# Patient Record
Sex: Female | Born: 1980 | Marital: Single | Smoking: Never smoker
Health system: Southern US, Community
[De-identification: ages and names within clinical notes are randomized; demographics above are authoritative.]

## PROBLEM LIST (undated history)

## (undated) DIAGNOSIS — J45909 Unspecified asthma, uncomplicated: Secondary | ICD-10-CM

## (undated) DIAGNOSIS — T7840XA Allergy, unspecified, initial encounter: Secondary | ICD-10-CM

## (undated) HISTORY — DX: Unspecified asthma, uncomplicated: J45.909

## (undated) HISTORY — DX: Allergy, unspecified, initial encounter: T78.40XA

---

## 2017-08-30 ENCOUNTER — Encounter: Payer: Self-pay | Admitting: Physician Assistant

## 2017-08-30 ENCOUNTER — Ambulatory Visit: Payer: No Typology Code available for payment source | Admitting: Physician Assistant

## 2017-08-30 ENCOUNTER — Other Ambulatory Visit: Payer: Self-pay

## 2017-08-30 VITALS — BP 122/78 | HR 91 | Temp 98.4°F | Resp 18 | Ht 65.2 in | Wt 227.6 lb

## 2017-08-30 DIAGNOSIS — Z7689 Persons encountering health services in other specified circumstances: Secondary | ICD-10-CM | POA: Diagnosis not present

## 2017-08-30 DIAGNOSIS — N926 Irregular menstruation, unspecified: Secondary | ICD-10-CM | POA: Diagnosis not present

## 2017-08-30 DIAGNOSIS — Z789 Other specified health status: Secondary | ICD-10-CM

## 2017-08-30 NOTE — Patient Instructions (Addendum)
I recommend you continue working out and eating a healthy diet.  For couples pursuing pregnancy, the highest probability of conception appears to be with intercourse one to two days prior to ovulation. Therefore, attempting to identify the fertile period and timing intercourse during this interval maximizes the probability of conception.   How To Track Ovulation: A woman's monthly cycle is measured from the first day of her menstrual period until the first day of her next period. On average, a woman's cycle normally is between 28-32 days, but some women may have much shorter or much longer cycles. Ovulation can be calculated by starting with the first day of the last menstrual period (LMP) or by calculating 12-16 days from the next expected period. Most women ovulate anywhere between Day 11 - Day 21 of their cycle, counting from the first day of the LMP. This is what many refer to as the "fertile time" of a woman's cycle because sexual intercourse during this time increases the chance of pregnancy. Ovulation can occur at various times during a cycle and may occur on a different day each month. It is important to track your cycle and fortunately, there are a number of free fertility charting tools available to help women identify their peak fertile days.   Please follow up in August for complete physical exam. Return sooner if you have any acute issues.  I would also like you to have your medical records sent over so I can go over them.  It was a pleasure meeting you today.    IF you received an x-ray today, you will receive an invoice from [facility]. Please contact [facility] at [phone] with questions or concerns regarding your invoice.   IF you received labwork today, you will receive an invoice from [facility]. Please contact [facility] at [phone] with questions or concerns regarding your invoice.   Our billing staff will not be able to assist you with questions  regarding bills from these companies.  You will be contacted with the lab results as soon as they are available. The fastest way to get your results is to activate your My Chart account. Instructions are located on the last page of this paperwork. If you have not heard from us regarding the results in 2 weeks, please contact this office.

## 2017-08-30 NOTE — Progress Notes (Addendum)
Kristine Vargas  MRN: [id number] DOB: 01/04/1981  Subjective:  Kristine Vargas is a 37 y.o. G1P1 female seen in office today for a chief complaint of menstrual problem and establish care.   She is from [place]. Just moved here in 04/2017 for a new job. Looking for new PCP.  Has PMH of seasonal allergies and intermittent asthma. She is taking allegra for allergies. Uses proair prn for intermittent asthma.   Irregular cycles: They occur once-twice per month for the past year.  Typically lasts 7 days. Denies dysmenorrhea and menorrhagia. Has been evaluated by gyn for this and had lab work/US, all of which was normal. Was determined to be from stress.She has been working on her diet. Decreased fast food consumption. Has been doing water aerobics 4-5 x a week.  For the past 3 months, cycles have been regular, occurring once per month.  She is trying to have another child and is just concerned. Has been trying for at least 6 months. She is not paying attention to when she is ovulating, she is just having unprotected sexual intercourse sporadically throughout the month (~4 x per month). Used to get depo injections, stopped about 5 years ago. She has read lots of stuff on the Internet that having had depo injections in the past could prevent her from having kids in the future Current child is 13 years. She is sexually active with monogamous partner. LMP 08/23/17. No hx of uterine abnormalities, thyroid disorder, or PCOS.    Review of Systems  Constitutional: Negative for activity change, appetite change, fatigue, fever and unexpected weight change.  Eyes: Negative for visual disturbance.  Gastrointestinal: Negative for abdominal pain, nausea and vomiting.  Endocrine: Negative for cold intolerance and heat intolerance.  Genitourinary: Negative for pelvic pain and vaginal pain.  Neurological: Negative for dizziness, light-headedness and headaches.    There are no active problems to display for this  patient.   No current outpatient medications on file prior to visit.   No current facility-administered medications on file prior to visit.     Allergies  Allergen Reactions  . Bactrim [Sulfamethoxazole-Trimethoprim]   . Ciprofloxacin   . Penicillins    Social History   Socioeconomic History  . Marital status: Single    Spouse name: Not on file  . Number of children: 1  . Years of education: Not on file  . Highest education level: Not on file  Occupational History  . Not on file  Social Needs  . Financial resource strain: Not on file  . Food insecurity:    Worry: Not on file    Inability: Not on file  . Transportation needs:    Medical: Not on file    Non-medical: Not on file  Tobacco Use  . Smoking status: Never Smoker  . Smokeless tobacco: Never Used  Substance and Sexual Activity  . Alcohol use: Yes    Comment: occ  . Drug use: Never  . Sexual activity: Yes  Lifestyle  . Physical activity:    Days per week: Not on file    Minutes per session: Not on file  . Stress: Not on file  Relationships  . Social connections:    Talks on phone: Not on file    Gets together: Not on file    Attends religious service: Not on file    Active member of club or organization: Not on file    Attends meetings of clubs or organizations: Not on file    Relationship  status: Not on file  . Intimate partner violence:    Fear of current or ex partner: Not on file    Emotionally abused: Not on file    Physically abused: Not on file    Forced sexual activity: Not on file  Other Topics Concern  . Not on file  Social History Narrative  . Not on file      Objective:  BP 122/78 (BP Location: Right Arm, Patient Position: Sitting, Cuff Size: Normal)   Pulse 91   Temp 98.4 F (36.9 C) (Oral)   Resp 18   Ht 5' 5.2" (1.656 m)   Wt 227 lb 9.6 oz (103.2 kg)   LMP 08/23/2017 (Approximate)   SpO2 98%   BMI 37.65 kg/m   Physical Exam  Constitutional: She is oriented to person,  place, and time. She appears well-developed and well-nourished. No distress.  HENT:  Head: Normocephalic and atraumatic.  Eyes: Conjunctivae and EOM are normal.  Neck: Normal range of motion.  Pulmonary/Chest: Effort normal.  Neurological: She is alert and oriented to person, place, and time.  Skin: Skin is warm and dry.  Psychiatric: She has a normal mood and affect. Her speech is normal and behavior is normal. Thought content normal.  Vitals reviewed.  Assessment and Plan :  1. Irregular menstrual cycle Cycles are currently regular with changes in diet and increasing physical activity.  Recommended she continue with these healthy lifestyle modifications.   2. Attempting to conceive Reassured patient that using depo injections 5 years ago is likely not contributing to her inability to conceive at this time.  Educated patient that it is likely due to to her having infrequent sexual intercourse and could be missing her timeframe of ovulation.  Educated patient on timing of ovulation.  Patient recently downloaded fertility and menstrual cycle app on phone but has not logged anything on it. Together, in office, we opened the app on her phone and logged her most recent menstrual cycle.  After this was logged, it gave a timeframe of when her ovulation is likely to occur.  Educated patient that the highest probability of conception appears to be with intercourse one to two days prior to ovulation. Therefore, engaging in sexual intercourse during that time frame interval maximizes the probability of conception. She voices her understanding.  She plans to follow-up in 4 months for CPE.  If at that time, she is still having difficulty conceiving despite increasing bouts of sexual intercourse and focusing on engaging in sexual intercourse during most fertile period, will refer to gyn for further evaluation.  3. Encounter to establish care I am happy to take over patient's care.  Recommended she have her  medical records from her prior PCP and gynecology office sent to our office so I can review labs and images that were ordered pertaining to her irregular menstrual cycles.  She is due for CPE in August.  Recommend she follow-up at that time for CPE.  A total of 30 minutes was spent in the room with the patient, greater than 50% of which was in counseling/coordination of care regarding irregular menstrual cycles and optimizing changes of conceiving.  Benjiman Core PA-C  Primary Care at [facility] 08/30/2017 4:33 PM

## 2018-04-21 ENCOUNTER — Encounter (HOSPITAL_COMMUNITY): Payer: Self-pay

## 2018-04-21 ENCOUNTER — Other Ambulatory Visit: Payer: Self-pay

## 2018-04-21 ENCOUNTER — Emergency Department (HOSPITAL_COMMUNITY)
Admission: EM | Admit: 2018-04-21 | Discharge: 2018-04-21 | Disposition: A | Discharge status: Home / Self Care | Payer: Self-pay | Attending: Emergency Medicine | Admitting: Emergency Medicine

## 2018-04-21 DIAGNOSIS — N3000 Acute cystitis without hematuria: Secondary | ICD-10-CM

## 2018-04-21 DIAGNOSIS — N3090 Cystitis, unspecified without hematuria: Secondary | ICD-10-CM | POA: Insufficient documentation

## 2018-04-21 LAB — URINALYSIS, ROUTINE W REFLEX MICROSCOPIC
Bilirubin Urine: NEGATIVE
Glucose, UA: NEGATIVE mg/dL
Ketones, ur: NEGATIVE mg/dL
NITRITE: NEGATIVE
Protein, ur: NEGATIVE mg/dL
Specific Gravity, Urine: 1.024 (ref 1.005–1.030)
pH: 6 (ref 5.0–8.0)

## 2018-04-21 LAB — POC URINE PREG, ED: Preg Test, Ur: NEGATIVE

## 2018-04-21 MED ORDER — NITROFURANTOIN MONOHYD MACRO 100 MG PO CAPS: 100.0000 mg | ORAL_CAPSULE | Freq: Two times a day (BID) | ORAL | 0 refills | Status: DC

## 2018-04-21 NOTE — ED Triage Notes (Addendum)
Pt states that she has had urinary frequency and cloudy urine. Pt states that her urine is malodorous as well. Pt denies back pain and states very slight pain with urination. Pt has had symptoms x 3 weeks.

## 2018-04-21 NOTE — ED Provider Notes (Signed)
[facility]-EMERGENCY DEPT Provider Note   CSN: [id number] Arrival date & time: 04/21/18  1731     History   Chief Complaint Chief Complaint  Patient presents with  . Urinary Frequency    HPI Kristine Vargas is a 37 y.o. female presenting for evaluation of urinary symptoms.  Patient states for the past 2 weeks she has had urinary frequency, and her urine has been dark and cloudy.  She states it has a different, more stronger urine smell.  She has mild suprapubic discomfort when urinating.  She denies fevers, chills, nausea, vomiting, or back pain.  She has been drinking water and cranberry juice without improvement of her symptoms.  Her last period was last week, it was regular for her.  She denies vaginal discharge or bleeding.  Patient states this feels similar to when she had urinary tract infections previously.  She is not sure which antibiotics worked for her previously.   HPI  Past Medical History:  Diagnosis Date  . Allergy   . Asthma     There are no active problems to display for this patient.   History reviewed. No pertinent surgical history.   OB History   None      Home Medications    Prior to Admission medications   Medication Sig Start Date End Date Taking? Authorizing Provider  nitrofurantoin, macrocrystal-monohydrate, (MACROBID) 100 MG capsule Take 1 capsule (100 mg total) by mouth 2 (two) times daily. 04/21/18   Yoselin Amerman, PA-C    Family History Family History  Problem Relation Age of Onset  . Arthritis Mother   . Hypertension Mother   . Hypertension Father   . Diabetes Father   . Hypertension Maternal Grandmother   . Hypertension Maternal Grandfather   . Hypertension Paternal Grandmother   . Diabetes Paternal Grandmother   . Diabetes Paternal Grandfather     Social History Social History   Tobacco Use  . Smoking status: Never Smoker  . Smokeless tobacco: Never Used  Substance Use Topics  . Alcohol use:  Yes    Comment: occ  . Drug use: Never     Allergies   Bactrim [sulfamethoxazole-trimethoprim]; Ciprofloxacin; and Penicillins   Review of Systems Review of Systems  Constitutional: Negative for fever.  Gastrointestinal: Negative for nausea and vomiting.  Genitourinary: Positive for frequency. Negative for dysuria.       Dark, cloudy, and more pungent urine     Physical Exam Updated Vital Signs BP 133/83 (BP Location: Right Arm)   Pulse 67   Temp 98.9 F (37.2 C) (Oral)   Resp 16   Ht 5\' 4"  (1.626 m)   Wt 104.3 kg   LMP 04/13/2018   SpO2 100%   BMI 39.48 kg/m   Physical Exam  Constitutional: She is oriented to person, place, and time. She appears well-developed and well-nourished. No distress.  Sitting comfortably in the bed in no acute distress  HENT:  Head: Normocephalic and atraumatic.  Eyes: EOM are normal.  Neck: Normal range of motion.  Cardiovascular: Normal rate, regular rhythm and intact distal pulses.  Pulmonary/Chest: Effort normal and breath sounds normal. No respiratory distress. She has no wheezes.  Abdominal: Soft. She exhibits no distension and no mass. There is no tenderness. There is no guarding.  No tenderness palpation of the abdomen.  Soft without rigidity, guarding, distention.  Negative rebound.  No CVA tenderness.  Musculoskeletal: Normal range of motion.  Neurological: She is alert and oriented to person, place,  and time.  Skin: Skin is warm. No rash noted.  Psychiatric: She has a normal mood and affect.  Nursing note and vitals reviewed.    ED Treatments / Results  Labs (all labs ordered are listed, but only abnormal results are displayed) Labs Reviewed  URINALYSIS, ROUTINE W REFLEX MICROSCOPIC - Abnormal; Notable for the following components:      Result Value   APPearance HAZY (*)    Hgb urine dipstick MODERATE (*)    Leukocytes, UA LARGE (*)    Bacteria, UA RARE (*)    All other components within normal limits  POC URINE  PREG, ED    EKG None  Radiology No results found.  Procedures Procedures (including critical care time)  Medications Ordered in ED Medications - No data to display   Initial Impression / Assessment and Plan / ED Course  I have reviewed the triage vital signs and the nursing notes.  Pertinent labs & imaging results that were available during my care of the patient were reviewed by me and considered in my medical decision making (see chart for details).     Patient presenting for evaluation of 2 weeks of urinary symptoms.  Physical exam reassuring, she is afebrile not tachycardic.  Appears nontoxic.  No abdominal tenderness or CVA tenderness.  Low suspicion for Pyelo or systemic infection.  Urine pending.  Urine positive for infection, shows large leuks, bacteria, and increased white cells.  Pregnancy negative.  Discussed findings with patient.  As patient is allergic to Bactrim, Cipro, and penicillins, will treat with Macrobid.  Patient encouraged to follow-up as needed if symptoms do not improve.  At this time, patient appears safe for discharge.  Return precautions given.  Patient states she understands and agrees to plan.   Final Clinical Impressions(s) / ED Diagnoses   Final diagnoses:  Acute cystitis without hematuria    ED Discharge Orders         Ordered    nitrofurantoin, macrocrystal-monohydrate, (MACROBID) 100 MG capsule  2 times daily     04/21/18 1904           Alveria Apley, PA-C 04/21/18 2243    Maia Plan, MD 04/22/18 [phone]

## 2018-04-21 NOTE — Discharge Instructions (Addendum)
Antibiotics as prescribed.  Take the entire course, even if your symptoms improve. Make sure you are staying well-hydrated with water. Use Tylenol and ibuprofen as needed for pain. Use heating pads to help with pain. Avoid things that increase inflammation including smoking and caffeine use. Return to the emergency room with any new, worsening, concerning symptoms

## 2018-05-27 ENCOUNTER — Emergency Department (HOSPITAL_COMMUNITY): Payer: Self-pay

## 2018-05-27 ENCOUNTER — Other Ambulatory Visit: Payer: Self-pay

## 2018-05-27 ENCOUNTER — Emergency Department (HOSPITAL_COMMUNITY)
Admission: EM | Admit: 2018-05-27 | Discharge: 2018-05-28 | Disposition: A | Discharge status: Home / Self Care | Payer: Self-pay | Attending: Emergency Medicine | Admitting: Emergency Medicine

## 2018-05-27 ENCOUNTER — Encounter (HOSPITAL_COMMUNITY): Payer: Self-pay

## 2018-05-27 DIAGNOSIS — O219 Vomiting of pregnancy, unspecified: Secondary | ICD-10-CM | POA: Insufficient documentation

## 2018-05-27 DIAGNOSIS — O9989 Other specified diseases and conditions complicating pregnancy, childbirth and the puerperium: Secondary | ICD-10-CM | POA: Insufficient documentation

## 2018-05-27 DIAGNOSIS — O26891 Other specified pregnancy related conditions, first trimester: Secondary | ICD-10-CM

## 2018-05-27 DIAGNOSIS — Z3A01 Less than 8 weeks gestation of pregnancy: Secondary | ICD-10-CM | POA: Insufficient documentation

## 2018-05-27 DIAGNOSIS — O2341 Unspecified infection of urinary tract in pregnancy, first trimester: Secondary | ICD-10-CM | POA: Insufficient documentation

## 2018-05-27 DIAGNOSIS — R109 Unspecified abdominal pain: Secondary | ICD-10-CM

## 2018-05-27 DIAGNOSIS — R102 Pelvic and perineal pain: Secondary | ICD-10-CM | POA: Insufficient documentation

## 2018-05-27 LAB — CBC WITH DIFFERENTIAL/PLATELET
Abs Immature Granulocytes: 0.02 10*3/uL (ref 0.00–0.07)
BASOS ABS: 0.1 10*3/uL (ref 0.0–0.1)
Basophils Relative: 1 %
Eosinophils Absolute: 0.3 10*3/uL (ref 0.0–0.5)
Eosinophils Relative: 2 %
HCT: 38.2 % (ref 36.0–46.0)
HEMOGLOBIN: 12 g/dL (ref 12.0–15.0)
Immature Granulocytes: 0 %
Lymphocytes Relative: 31 %
Lymphs Abs: 3.3 10*3/uL (ref 0.7–4.0)
MCH: 27.1 pg (ref 26.0–34.0)
MCHC: 31.4 g/dL (ref 30.0–36.0)
MCV: 86.2 fL (ref 80.0–100.0)
Monocytes Absolute: 0.9 10*3/uL (ref 0.1–1.0)
Monocytes Relative: 8 %
Neutro Abs: 6.3 10*3/uL (ref 1.7–7.7)
Neutrophils Relative %: 58 %
Platelets: 406 10*3/uL — ABNORMAL HIGH (ref 150–400)
RBC: 4.43 MIL/uL (ref 3.87–5.11)
RDW: 14 % (ref 11.5–15.5)
WBC: 10.8 10*3/uL — ABNORMAL HIGH (ref 4.0–10.5)
nRBC: 0 % (ref 0.0–0.2)

## 2018-05-27 LAB — COMPREHENSIVE METABOLIC PANEL
ALBUMIN: 3.4 g/dL — AB (ref 3.5–5.0)
ALT: 17 U/L (ref 0–44)
AST: 18 U/L (ref 15–41)
Alkaline Phosphatase: 50 U/L (ref 38–126)
Anion gap: 9 (ref 5–15)
BUN: 10 mg/dL (ref 6–20)
CO2: 24 mmol/L (ref 22–32)
Calcium: 9.1 mg/dL (ref 8.9–10.3)
Chloride: 104 mmol/L (ref 98–111)
Creatinine, Ser: 0.77 mg/dL (ref 0.44–1.00)
GFR calc Af Amer: >60 mL/min (ref >60)
GFR calc non Af Amer: >60 mL/min (ref >60)
Glucose, Bld: 96 mg/dL (ref 70–99)
Potassium: 3.7 mmol/L (ref 3.5–5.1)
Sodium: 137 mmol/L (ref 135–145)
Total Bilirubin: 0.4 mg/dL (ref 0.3–1.2)
Total Protein: 7.4 g/dL (ref 6.5–8.1)

## 2018-05-27 LAB — URINALYSIS, ROUTINE W REFLEX MICROSCOPIC
Bilirubin Urine: NEGATIVE
Glucose, UA: NEGATIVE mg/dL
KETONES UR: NEGATIVE mg/dL
Nitrite: NEGATIVE
PROTEIN: NEGATIVE mg/dL
RBC / HPF: >50 RBC/hpf — ABNORMAL HIGH (ref 0–5)
Specific Gravity, Urine: 1.027 (ref 1.005–1.030)
pH: 5 (ref 5.0–8.0)

## 2018-05-27 LAB — I-STAT BETA HCG BLOOD, ED (MC, WL, AP ONLY): I-stat hCG, quantitative: >2000 m[IU]/mL — ABNORMAL HIGH (ref <5)

## 2018-05-27 LAB — HCG, QUANTITATIVE, PREGNANCY: hCG, Beta Chain, Quant, S: 98366 m[IU]/mL — ABNORMAL HIGH (ref <5)

## 2018-05-27 LAB — LIPASE, BLOOD: Lipase: 27 U/L (ref 11–51)

## 2018-05-27 MED ORDER — PROMETHAZINE HCL 12.5 MG PO TABS: 12.5000 mg | ORAL_TABLET | Freq: Four times a day (QID) | ORAL | 0 refills | Status: AC | PRN

## 2018-05-27 MED ORDER — CEPHALEXIN 500 MG PO CAPS: 500.0000 mg | ORAL_CAPSULE | Freq: Four times a day (QID) | ORAL | 0 refills | Status: AC

## 2018-05-27 NOTE — ED Notes (Signed)
Patient transported to Ultrasound 

## 2018-05-27 NOTE — Discharge Instructions (Addendum)

## 2018-05-27 NOTE — ED Triage Notes (Signed)
Pt states she was seen for a UTI about a month ago and had taken the abx for that. Pt states that her symptoms have not cleared up since then. Pt also states she has been having sharp LLQ pain that "makes me jump". Pt also states SHOB with the pain. Pt states she has had unprotected sex as well, and could be pregnant.

## 2018-05-27 NOTE — ED Provider Notes (Signed)
Patient taken in sign out from [name] 38 year old female who is pregnant, awaiting ultrasound here with lower abdominal pain.  UA positive for urinary tract infection. Since ultrasound shows single IUP within the uterus.  No concern for heterotopic pregnancy.  Informed patient of findings.  She will be discharged with Keflex and Phenergan.  She was appropriate for discharge at this time   Arthor Captain, Kristine Vargas 05/28/18 0712    Tegeler, Kristine Brim, MD 05/28/18 1045

## 2018-05-27 NOTE — ED Provider Notes (Signed)
[facility]-EMERGENCY DEPT Provider Note   CSN: [id number] Arrival date & time: 05/27/18  1626     History   Chief Complaint Chief Complaint  Patient presents with  . Abdominal Pain    HPI Kristine Vargas is a 38 y.o. female who present to the ED with lower abdominal pain, vaginal d/c and UTI symptoms. Patient concerned because she has had unprotected sex and could be pregnant. LMP 04/11/18. Patient's pregnancy test when she was here a month ago was negative. Patient reports no concern for STI's.   The history is provided by the patient. No language interpreter was used.  Abdominal Pain  Pain location:  LLQ Pain quality: sharp and stabbing   Pain radiates to:  Does not radiate Pain severity:  Severe Onset quality:  Gradual Duration:  2 weeks Timing:  Intermittent Progression:  Worsening Chronicity:  New Relieved by:  Nothing Worsened by:  Movement Ineffective treatments:  None tried Associated symptoms: no chest pain, no chills, no cough, no dysuria, no fever, no vaginal bleeding and no vaginal discharge     Past Medical History:  Diagnosis Date  . Allergy   . Asthma     There are no active problems to display for this patient.   History reviewed. No pertinent surgical history.   OB History   No obstetric history on file.      Home Medications    Prior to Admission medications   Medication Sig Start Date End Date Taking? Authorizing Provider  cephALEXin (KEFLEX) 500 MG capsule Take 1 capsule (500 mg total) by mouth 4 (four) times daily. 05/27/18   Janne NapoleonNeese, Hope M, NP  promethazine (PHENERGAN) 12.5 MG tablet Take 1 tablet (12.5 mg total) by mouth every 6 (six) hours as needed for nausea or vomiting. 05/27/18   Janne NapoleonNeese, Hope M, NP    Family History Family History  Problem Relation Age of Onset  . Arthritis Mother   . Hypertension Mother   . Hypertension Father   . Diabetes Father   . Hypertension Maternal Grandmother   . Hypertension  Maternal Grandfather   . Hypertension Paternal Grandmother   . Diabetes Paternal Grandmother   . Diabetes Paternal Grandfather     Social History Social History   Tobacco Use  . Smoking status: Never Smoker  . Smokeless tobacco: Never Used  Substance Use Topics  . Alcohol use: Yes    Comment: occ  . Drug use: Never     Allergies   Bactrim [sulfamethoxazole-trimethoprim]; Ciprofloxacin; and Penicillins   Review of Systems Review of Systems  Constitutional: Negative for chills and fever.  HENT: Negative.   Eyes: Negative for visual disturbance.  Respiratory: Negative for cough.   Cardiovascular: Negative for chest pain.  Gastrointestinal: Positive for abdominal pain.  Genitourinary: Positive for frequency. Negative for dysuria, vaginal bleeding and vaginal discharge.  Musculoskeletal: Negative for back pain.  Skin: Negative for rash.  Neurological: Negative for headaches.  Psychiatric/Behavioral: Negative for confusion.     Physical Exam Updated Vital Signs BP 121/84   Pulse 70   Temp 98.5 F (36.9 C) (Oral)   Resp 18   Ht 5\' 4"  (1.626 m)   Wt 104.3 kg   LMP 04/11/2018   SpO2 100%   BMI 39.48 kg/m   Physical Exam Vitals signs and nursing note reviewed.  Constitutional:      General: She is not in acute distress.    Appearance: She is well-developed.  HENT:     Head:  Normocephalic.     Nose: Nose normal.     Mouth/Throat:     Mouth: Mucous membranes are moist.  Eyes:     Extraocular Movements: Extraocular movements intact.  Neck:     Musculoskeletal: Neck supple.  Cardiovascular:     Rate and Rhythm: Normal rate and regular rhythm.  Pulmonary:     Effort: Pulmonary effort is normal.     Breath sounds: Normal breath sounds.  Abdominal:     General: Bowel sounds are normal. There is no distension.     Palpations: Abdomen is soft.     Tenderness: There is abdominal tenderness in the right upper quadrant. There is no right CVA tenderness, left CVA  tenderness, guarding or rebound. Negative signs include Rovsing's sign, McBurney's sign, psoas sign and obturator sign.  Musculoskeletal: Normal range of motion.  Skin:    General: Skin is warm and dry.  Neurological:     Mental Status: She is alert and oriented to person, place, and time.     Cranial Nerves: No cranial nerve deficit.  Psychiatric:        Mood and Affect: Mood normal.      ED Treatments / Results  Labs (all labs ordered are listed, but only abnormal results are displayed) Labs Reviewed  CBC WITH DIFFERENTIAL/PLATELET - Abnormal; Notable for the following components:      Result Value   WBC 10.8 (*)    Platelets 406 (*)    All other components within normal limits  COMPREHENSIVE METABOLIC PANEL - Abnormal; Notable for the following components:   Albumin 3.4 (*)    All other components within normal limits  URINALYSIS, ROUTINE W REFLEX MICROSCOPIC - Abnormal; Notable for the following components:   APPearance HAZY (*)    Hgb urine dipstick LARGE (*)    Leukocytes, UA MODERATE (*)    RBC / HPF >50 (*)    Bacteria, UA RARE (*)    All other components within normal limits  HCG, QUANTITATIVE, PREGNANCY - Abnormal; Notable for the following components:   hCG, Beta Chain, Quant, S 98,366 (*)    All other components within normal limits  I-STAT BETA HCG BLOOD, ED ([facility], [facility], [facility] ONLY) - Abnormal; Notable for the following components:   I-stat hCG, quantitative >2,000.0 (*)    All other components within normal limits  URINE CULTURE  LIPASE, BLOOD  Radiology No results found.  Procedures Procedures (including critical care time)  Medications Ordered in ED Medications - No data to display   Initial Impression / Assessment and Plan / ED Course  I have reviewed the triage vital signs and the nursing notes.  Clinical Course as of May 27 2249  Fri May 27, 2018  2042 Urinalysis, Routine w reflex microscopic [GS]    Clinical Course User Index [GS] Tawny AsalStevenson,  George H, Student-PA     Final Clinical Impressions(s) / ED Diagnoses   Final diagnoses:  Pelvic pain in female  Abdominal pain during pregnancy in first trimester  Nausea and vomiting in pregnancy  UTI in pregnancy, antepartum, first trimester   Ultrasound results pending. Care turned over to A. Harris, PAC @ 10:50 pm.  ED Discharge Orders         Ordered    cephALEXin (KEFLEX) 500 MG capsule  4 times daily     05/27/18 2250    promethazine (PHENERGAN) 12.5 MG tablet  Every 6 hours PRN     05/27/18 2250  Kerrie [facility], NP 05/27/18 2252    Samuel Jester, DO 05/29/18 1651

## 2018-05-28 MED ORDER — METOCLOPRAMIDE HCL 10 MG PO TABS
5.0000 mg | ORAL_TABLET | Freq: Once | ORAL | Status: AC
  Administered 2018-05-28: 5 mg via ORAL
  Filled 2018-05-28: qty 0.5

## 2018-05-28 MED ORDER — CEPHALEXIN 500 MG PO CAPS
500.0000 mg | ORAL_CAPSULE | Freq: Once | ORAL | Status: AC
  Administered 2018-05-28: 500 mg via ORAL
  Filled 2018-05-28: qty 1

## 2020-01-28 IMAGING — US US OB < 14 WEEKS - US OB TV
1 series · 13 of 28 positions shown · non-contrast
Comparison: None.

CLINICAL DATA: Lower abdominal pain and pelvic pain for 2 weeks.
Estimated gestational age by LMP is 6 weeks 4 days. Quantitative
beta HCG is [DATE].

EXAM:
OBSTETRIC <14 WK US AND TRANSVAGINAL OB US
TECHNIQUE: Both transabdominal and transvaginal ultrasound examinations were
performed for complete evaluation of the gestation as well as the
maternal uterus, adnexal regions, and pelvic cul-de-sac.
Transvaginal technique was performed to assess early pregnancy.

[Series 1: us ob < 14 weeks - us ob tv · 13 of 61 slices shown]
[im 3/61]
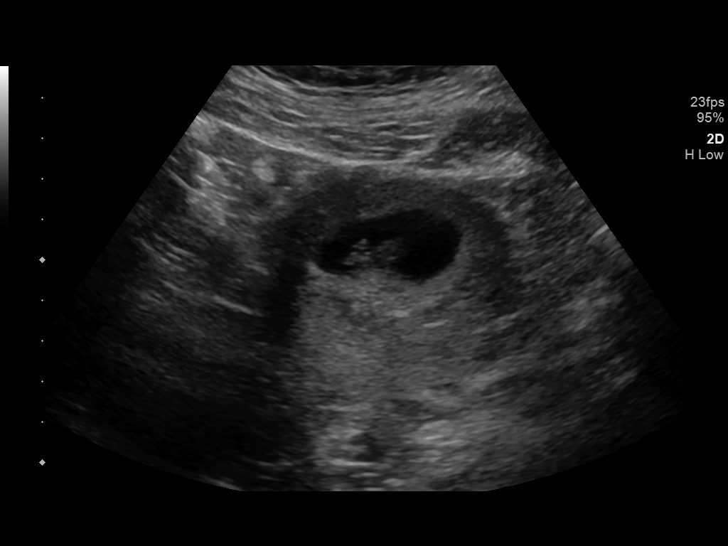
[im 7/61]
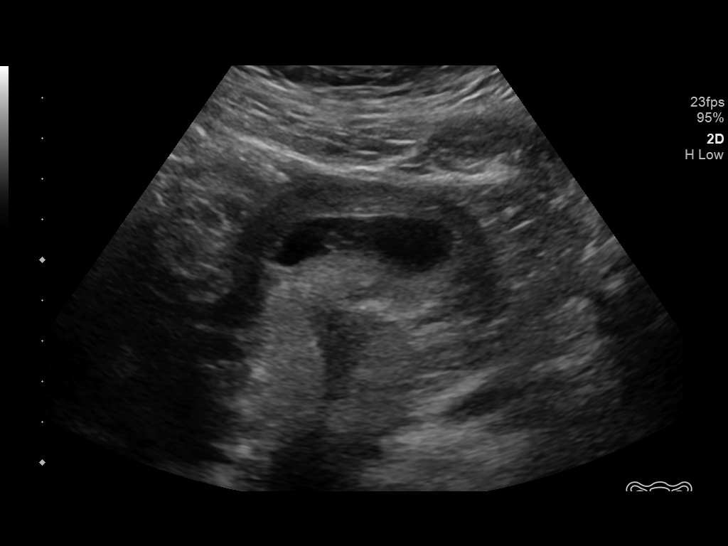
[im 12/61]
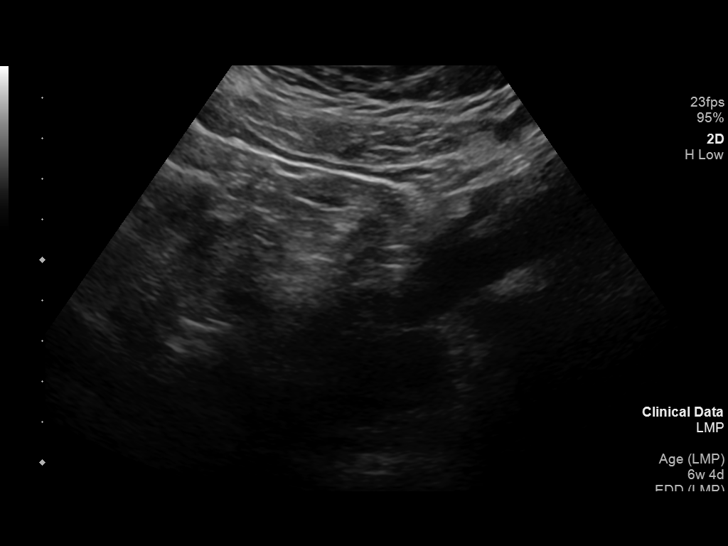
[im 16/61]
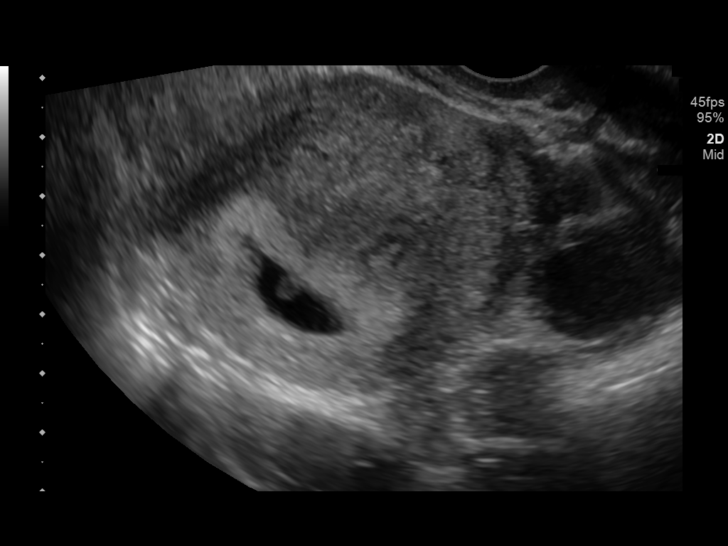
[im 21/61]
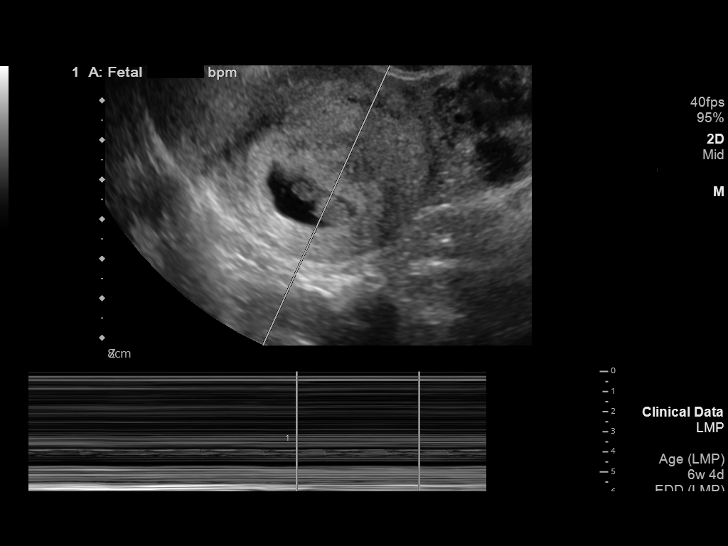
[im 25/61]
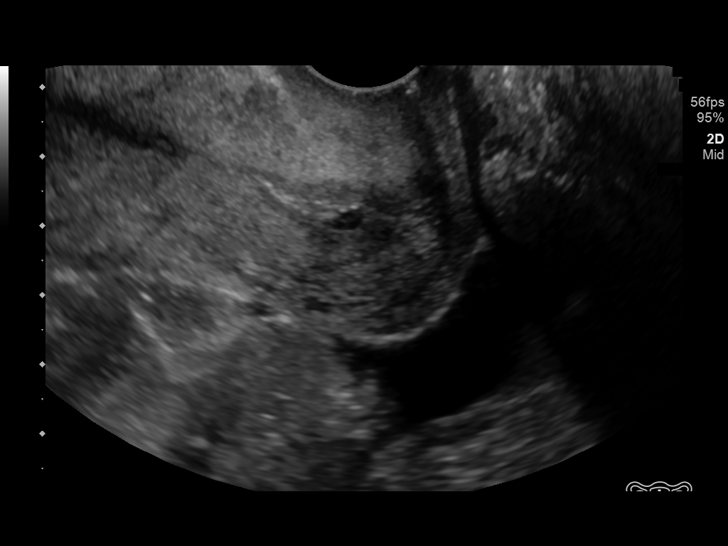
[im 32/61]
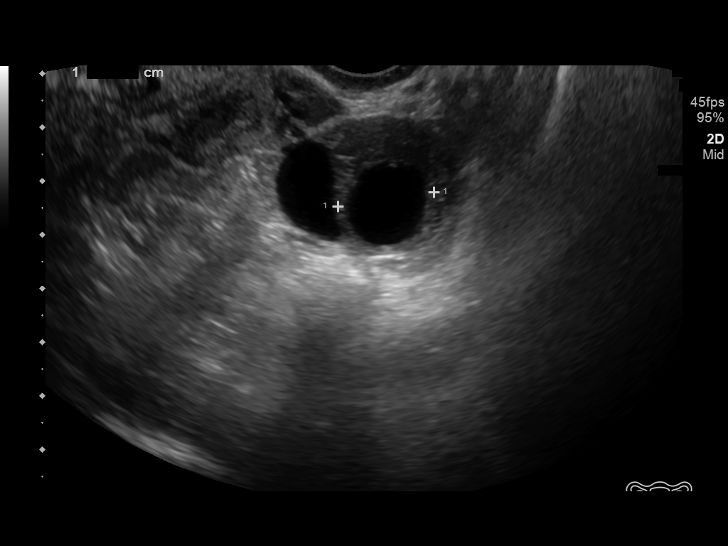
[im 36/61]
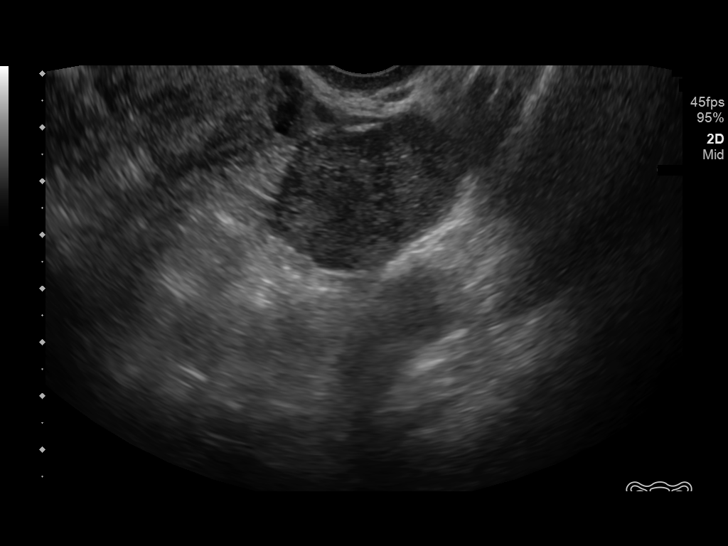
[im 41/61]
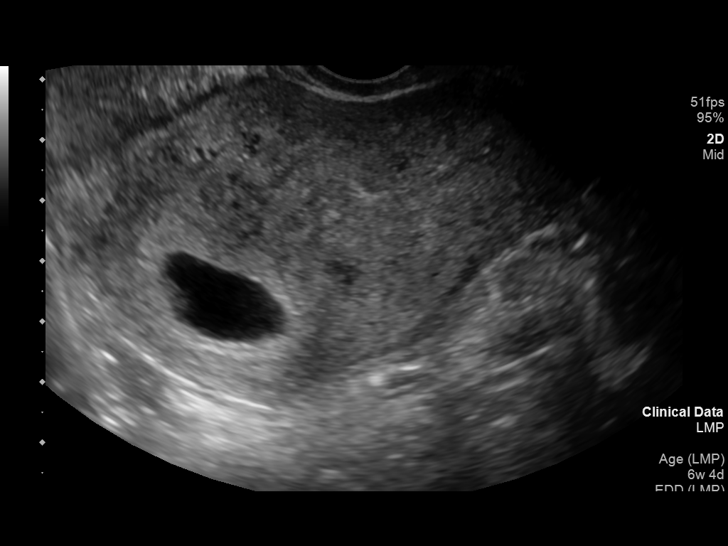
[im 45/61]
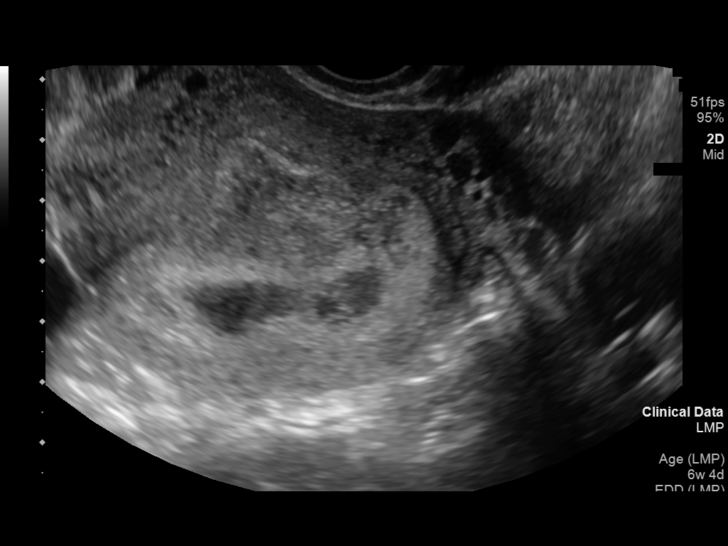
[im 49/61]
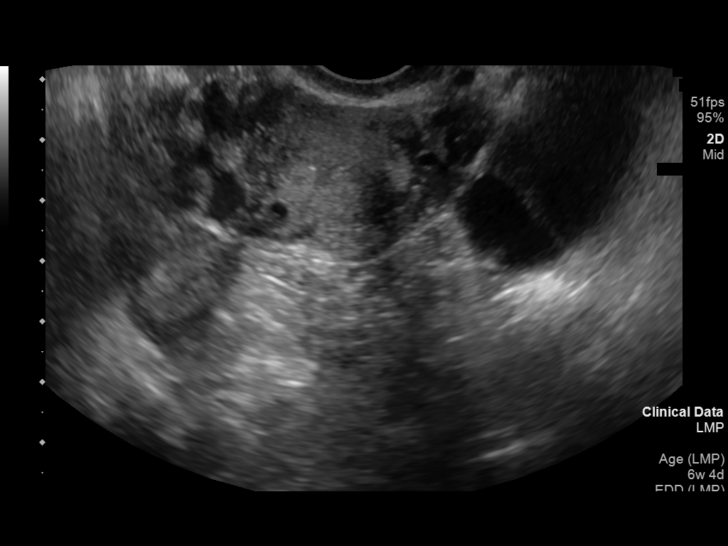
[im 54/61]
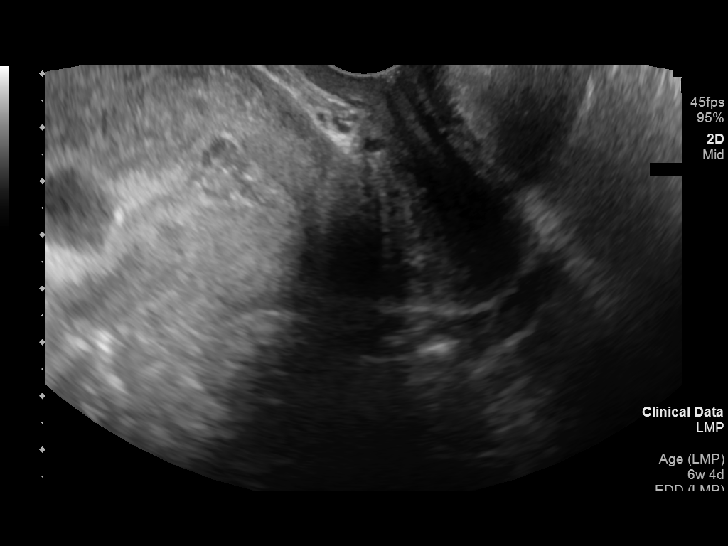
[im 58/61]
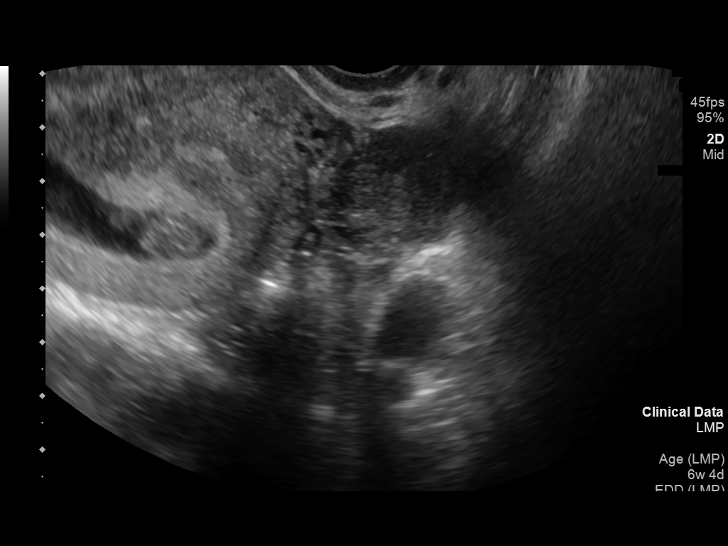

[13 of 28 positions shown; findings below may reference images not displayed]

FINDINGS: Intrauterine gestational sac: A single intrauterine gestational sac
is present.

Yolk sac:  Yolk sac is present.

Embryo:  Fetal pole is present.

Cardiac Activity: Fetal cardiac activity is observed.

Heart Rate: 159 bpm

CRL:  16 mm   8 w   0 d                  US EDC: 01/06/2019

Subchorionic hemorrhage:  None visualized.

Maternal uterus/adnexae: Uterus is anteverted. No myometrial mass
lesions identified. Both ovaries are visualized and appear normal.
Corpus luteal cyst on the right. Small amount of free fluid.
IMPRESSION: Single intrauterine pregnancy. Estimated gestational age by
crown-rump length is 8 weeks 0 days. No acute complication is
suggested sonographically.
